# Patient Record
Sex: Female | Born: 1980 | Race: White | Hispanic: No | Marital: Married | Smoking: Never smoker
Health system: Southern US, Community
[De-identification: ages and names within clinical notes are randomized; demographics above are authoritative.]

## PROBLEM LIST (undated history)

## (undated) DIAGNOSIS — K219 Gastro-esophageal reflux disease without esophagitis: Secondary | ICD-10-CM

## (undated) DIAGNOSIS — J329 Chronic sinusitis, unspecified: Secondary | ICD-10-CM

## (undated) HISTORY — PX: TYMPANOSTOMY TUBE PLACEMENT: SHX32

## (undated) HISTORY — DX: Chronic sinusitis, unspecified: J32.9

## (undated) HISTORY — DX: Gastro-esophageal reflux disease without esophagitis: K21.9

---

## 2016-11-08 ENCOUNTER — Ambulatory Visit (INDEPENDENT_AMBULATORY_CARE_PROVIDER_SITE_OTHER): Payer: BLUE CROSS/BLUE SHIELD | Admitting: Pediatrics

## 2016-11-08 ENCOUNTER — Encounter: Payer: Self-pay | Admitting: Pediatrics

## 2016-11-08 VITALS — BP 116/78 | HR 60 | Temp 97.9°F | Resp 16 | Ht 64.57 in | Wt 133.8 lb

## 2016-11-08 DIAGNOSIS — K219 Gastro-esophageal reflux disease without esophagitis: Secondary | ICD-10-CM

## 2016-11-08 DIAGNOSIS — J0100 Acute maxillary sinusitis, unspecified: Secondary | ICD-10-CM

## 2016-11-08 DIAGNOSIS — M2669 Other specified disorders of temporomandibular joint: Secondary | ICD-10-CM | POA: Diagnosis not present

## 2016-11-08 DIAGNOSIS — J3089 Other allergic rhinitis: Secondary | ICD-10-CM | POA: Diagnosis not present

## 2016-11-08 DIAGNOSIS — M26649 Arthritis of unspecified temporomandibular joint: Secondary | ICD-10-CM | POA: Insufficient documentation

## 2016-11-08 MED ORDER — AMOXICILLIN-POT CLAVULANATE 875-125 MG PO TABS: ORAL_TABLET | ORAL | 0 refills | Status: DC

## 2016-11-08 MED ORDER — FLUTICASONE PROPIONATE 50 MCG/ACT NA SUSP: 1.0000 | Freq: Two times a day (BID) | NASAL | 5 refills | Status: DC

## 2016-11-08 MED ORDER — OMEPRAZOLE 20 MG PO CPDR: 20.0000 mg | DELAYED_RELEASE_CAPSULE | Freq: Every day | ORAL | 5 refills | Status: DC

## 2016-11-08 NOTE — Patient Instructions (Addendum)
Environmental control of dust and mold Claritin 10 mg once a day for runny nose Nasal saline irrigations twice a day followed by fluticasone 1 spray per nostril twice a day Add prednisone 20 mg twice a day for 3 days, 20 mg on the fourth day, 10 mg on the fifth day See your dentist regarding TMJ.  Syndrome giving you headaches Augmentin 875 mg every 12 hours for 10 days for infection  Call me if you're not doing well on this treatment plan Continue omeprazole 20 mg once a day for acid reflux

## 2016-11-08 NOTE — Progress Notes (Signed)
[address] [place] [address] Dept: [phone]  New Patient Note  Patient ID: Lindsey KnackHeather Hall, female    DOB: 1981/02/05  Age: 35 y.o. MRN: [id number] Date of Office Visit: 11/08/2016 Referring provider: No referring provider defined for this encounter.    Chief Complaint: Nasal Congestion (lots of mucus, ears feel warm inside.) and Food Intolerance (avocado has vomiting and diarrhea for about 1 to 2 days later leaving her stomach upset for several days)  HPI Lindsey KnackHeather Hall presents for evaluation of sinus pressure, headaches, postnasal drainage since July 2017. Her postnasal drainage has been discolored. She did not have a great deal of relief from Levaquin. She has a history of seasonal allergic rhinitis for many years. As a child she needed ventilation tubes twice. She has aggravation of her nasal symptoms on exposure to dust and cigarette smoke. Omeprazole has helped her heartburn and stomach irritation. She had pneumonia once at [age] or 35 years of age. She has never had asthma or eczema or hives. She has tried montelukast 10 and fluticasone nasal spray without a great deal of relief  Review of Systems  Constitutional: Negative.   HENT:       Nasal congestion, sinus pressure and  recurrent sinus infections since July of this year. History of seasonal allergic rhinitis  Eyes: Negative.   Respiratory: Negative.   Cardiovascular: Negative.   Gastrointestinal:       Heartburn at times  Genitourinary: Negative.   Musculoskeletal: Negative.   Skin: Negative.   Neurological: Negative.   Endo/Heme/Allergies:       No diabetes or thyroid disease  Psychiatric/Behavioral: Negative.     Outpatient Encounter Prescriptions as of 11/08/2016  Medication Sig  . acetaminophen (TYLENOL) 325 MG tablet Take by mouth.  . clindamycin (CLEOCIN T) 1 % lotion   . fluticasone (FLONASE) 50 MCG/ACT nasal spray Place 1 spray into both nostrils 2 (two) times daily.  Marland Kitchen. ibuprofen (ADVIL,MOTRIN)  200 MG tablet Take 200 mg by mouth.  . magnesium citrate (V-R MAGNESIUM CITRATE) SOLN Take 296 mLs by mouth.  . montelukast (SINGULAIR) 10 MG tablet   . Multiple Vitamin (MULTIVITAMIN) capsule Take by mouth.  Marland Kitchen. omeprazole (PRILOSEC) 20 MG capsule Take 1 capsule (20 mg total) by mouth daily.  Marland Kitchen. spironolactone (ALDACTONE) 50 MG tablet   . tretinoin (RETIN-A) 0.025 % cream   . [DISCONTINUED] fluticasone (FLONASE) 50 MCG/ACT nasal spray Place into the nose.  . [DISCONTINUED] omeprazole (PRILOSEC) 20 MG capsule Take 20 mg by mouth daily.  Marland Kitchen. amoxicillin-clavulanate (AUGMENTIN) 875-125 MG tablet Take 1 tablet every 12 hours for 10 days for infection   No facility-administered encounter medications on file as of 11/08/2016.      Drug Allergies:  Allergies  Allergen Reactions  . Other     avocado caused vomiting and diarrhea with stomach discomfort for several days    Family History: Deola's family history includes Migraines in her mother... Her brother has asthma. Her father has allergic rhinitis. There is no family history of eczema, hives, food allergies, chronic bronchitis or emphysema.  Social and environmental. They have 1 dog in the home. She is not exposed to cigarette smoking. She is a Manufacturing systems engineerpreschool teacher. She has not smoked cigarettes in the past  Physical Exam: BP 116/78 (BP Location: Right Arm, Patient Position: Sitting, Cuff Size: Normal)   Pulse 60   Temp 97.9 F (36.6 C) (Oral)   Resp 16   Ht 5' 4.57" (1.64 m)   Wt 133 lb  13.1 oz (60.7 kg)   BMI 22.57 kg/m    Physical Exam  Constitutional: She is oriented to person, place, and time. She appears well-developed and well-nourished.  HENT:  Eyes normal. Ears normal. Nose moderate swelling of nasal turbinates. Pharynx normal except for yellow-green postnasal drainage  Neck: Neck supple. No thyromegaly present.  Tender TM joints bilaterally  Cardiovascular:  S1 and S2 normal no murmurs  Pulmonary/Chest:  Clear to  percussion and auscultation  Abdominal: Soft. There is no tenderness (no hepatosplenomegaly).  Lymphadenopathy:    She has no cervical adenopathy.  Neurological: She is alert and oriented to person, place, and time.  Skin:  Clear  Psychiatric: She has a normal mood and affect. Her behavior is normal. Judgment and thought content normal.  Vitals reviewed.   Diagnostics:  Allergy skin tests were positive to some molds on intradermal testing only  Assessment  Assessment and Plan: 1. Other allergic rhinitis   2. Gastroesophageal reflux disease without esophagitis   3. Acute non-recurrent maxillary sinusitis   4. TMJ arthritis     Meds ordered this encounter  Medications  . amoxicillin-clavulanate (AUGMENTIN) 875-125 MG tablet    Sig: Take 1 tablet every 12 hours for 10 days for infection    Dispense:  20 tablet    Refill:  0  . fluticasone (FLONASE) 50 MCG/ACT nasal spray    Sig: Place 1 spray into both nostrils 2 (two) times daily.    Dispense:  16 g    Refill:  5  . omeprazole (PRILOSEC) 20 MG capsule    Sig: Take 1 capsule (20 mg total) by mouth daily.    Dispense:  30 capsule    Refill:  5    Patient Instructions  Environmental control of dust and mold Claritin 10 mg once a day for runny nose Nasal saline irrigations twice a day followed by fluticasone 1 spray per nostril twice a day Add prednisone 20 mg twice a day for 3 days, 20 mg on the fourth day, 10 mg on the fifth day See your dentist regarding TMJ.  Syndrome giving you headaches Augmentin 875 mg every 12 hours for 10 days for infection  Call me if you're not doing well on this treatment plan Continue omeprazole 20 mg once a day for acid reflux    Return in about 4 weeks (around 12/06/2016).   Thank you for the opportunity to care for this patient.  Please do not hesitate to contact me with questions.  Tonette BihariJ. A. Sophee Mckimmy, M.D.  [facility] [address] [place], [place]  [id number] [phone]

## 2017-05-14 ENCOUNTER — Other Ambulatory Visit: Payer: Self-pay | Admitting: Pediatrics

## 2018-06-15 ENCOUNTER — Ambulatory Visit (INDEPENDENT_AMBULATORY_CARE_PROVIDER_SITE_OTHER): Payer: PRIVATE HEALTH INSURANCE | Admitting: Family Medicine

## 2018-06-15 ENCOUNTER — Encounter: Payer: Self-pay | Admitting: Family Medicine

## 2018-06-15 VITALS — BP 100/66 | HR 73 | Temp 98.3°F | Resp 16 | Ht 65.0 in | Wt 126.2 lb

## 2018-06-15 DIAGNOSIS — K219 Gastro-esophageal reflux disease without esophagitis: Secondary | ICD-10-CM | POA: Diagnosis not present

## 2018-06-15 DIAGNOSIS — J0100 Acute maxillary sinusitis, unspecified: Secondary | ICD-10-CM | POA: Diagnosis not present

## 2018-06-15 DIAGNOSIS — J3089 Other allergic rhinitis: Secondary | ICD-10-CM

## 2018-06-15 NOTE — Patient Instructions (Addendum)
Continue environmental control of dust and mold Allegra 180 mg once a day for runny nose. Hold this for now while you are congested.  Begin Mucinex [phone] mg twice a day to thin mucus secretions. Nasal saline irrigations twice a day followed by fluticasone 1 spray per nostril twice a day Continue omeprazole 20 mg once a day for acid reflux Prednisone 10 mg tablets. Take 2 tablets twice a day for 3 days, then take 2 tablets once a day for 1 day, then take 1 tablet once a day for 1 day, then stop. Return for environmental skin testing. Do not take any antihistamines 3 days prior to skin testing appointment Follow up with ENT for further evaluation of your left ear. You [facility]id [facility]  Call me if you're not doing well on this treatment plan  Follow up in the clinic for skin testing in 1-2 weeks

## 2018-06-15 NOTE — Progress Notes (Addendum)
[address] [place] [address] Dept: [phone]  FOLLOW UP NOTE  Patient ID: Lindsey KnackHeather Hall, female    DOB: 1981/09/27  Age: 37 y.o. MRN: [id number] Date of Office Visit: 06/15/2018  Assessment  Chief Complaint: Otalgia and Nasal Congestion  HPI Lindsey KnackHeather Hall is a 37 year old female who presents to the clinic for a follow up visit. She was last seen in this clinic on 11/08/2016 by Dr. Beaulah DinningBardelas for evaluation of allergic rhinitis, reflux, acute sinusitis, and TMJ arthritis. At that time, she was prescribed a course of Augmentin. She also began Flonase nasal spray and Prilosec once a day. Her intradermal skin test was only positive to mold mix 4.   In the interim, she reports that she visited an Urgent Care for acute sinusitis and was given Augmentin which she finished about the first week of June with resolution of symptoms.   At today's visit, she reports increased sinus pressure, nasal congestion, thick yellow drainage when blowing her nose. She reports the sinus pressure is mainly below both eyes with the left more than the right. She denies fever, sweats, or chills and sick contacts. She [place] and has been experiencing sinus and nasal congestion in the late spring/earyl summer and fall seasons for the last 2 years. Her symptoms are aggravated by being outside. She continues NeilMed rinses twice a day and Flonase nasal spray daily. She reports that she has started to experience intermittent discharge from her left ear and frequent popping noises over the last week which began after blowing her nose extremely hard. She denies ear pain. She reports a long history of childhood ear infections and 2 sets of tympanostomy tubes.  Her current medications are listed in the chart.   Drug Allergies:  Allergies  Allergen Reactions  . Other     avocado caused vomiting and diarrhea with stomach discomfort for several days     Physical Exam: BP 100/66 (BP Location: Right Arm, Patient Position: Sitting, Cuff Size: Normal)   Pulse 73   Temp 98.3 F (36.8 C) (Oral)   Resp 16   Ht 5\' 5"  (1.651 m)   Wt 126 lb 3.2 oz (57.2 kg)   SpO2 99%   BMI 21.00 kg/m    Physical Exam  Constitutional: She is oriented to person, place, and time. She appears well-developed and well-nourished.  HENT:  Head: Normocephalic.  Right Ear: External ear normal.  Mouth/Throat: Oropharynx is clear and moist.  Left ear TM with no light reflex and possible perforation. Bilateral nares slightly erythematous and edematous with no nasal drainage noted. Pharynx normal. Eyes normal.   Eyes: Conjunctivae are normal.  Neck: Normal range of motion. Neck supple.  Cardiovascular: Normal rate, regular rhythm and normal heart sounds.  No murmur noted.  Pulmonary/Chest: Effort normal and breath sounds normal.  Lungs clear to auscultation  Musculoskeletal: Normal range of motion.  Neurological: She is alert and oriented to person, place, and time.  Skin: Skin is warm and dry.  Psychiatric: She has a normal mood and affect. Her behavior is normal. Judgment and thought content normal.      Assessment and Plan: 1. Other allergic rhinitis   2. Gastroesophageal reflux disease without esophagitis   3. Acute non-recurrent maxillary sinusitis     Patient Instructions  Continue environmental control of dust and mold Allegra 180 mg once a day for runny nose. Hold this for now while you are  congested.  Begin Mucinex [phone] mg twice a day to thin mucus secretions. Nasal saline irrigations twice a day followed by fluticasone 1 spray per nostril twice a day Continue omeprazole 20 mg once a day for acid reflux Prednisone 10 mg tablets. Take 2 tablets twice a day for 3 days, then take 2 tablets once a day for 1 day, then take 1 tablet once a day for 1 day, then stop. Return for environmental skin testing. Do not take any antihistamines 3 days prior  to skin testing appointment Follow up with ENT for further evaluation of your left ear. You saw Dr[facility] Richardson Landry at [facility]  Call me if you're not doing well on this treatment plan  Follow up in the clinic for skin testing in 1-2 weeks   Return in about 2 weeks (around 06/29/2018), or if symptoms worsen or fail to improve.    Thank you for the opportunity to care for this patient.  Please do not hesitate to contact me with questions.  Thermon Leyland, FNP [facility]  ----------------------------------------------------------  I have provided oversight concerning [facility]'s evaluation and treatment of this patient's health issues addressed during today's encounter.  I agree with the assessment and therapeutic plan as outlined in the note.   Signed,   R Jorene Guest, MD

## 2018-06-19 ENCOUNTER — Telehealth: Payer: Self-pay | Admitting: *Deleted

## 2018-06-19 NOTE — Telephone Encounter (Signed)
Pt informed. She is doing sinus rinses bid and Flonase as directed. Her mucus is still green and she continues having dizziness. The ENT did prescribe an antibiotic ear drop that she is using. Instructed her again from Anne's last visit note to stop the allegra and take Mucinex [phone] bid with plenty of water. She stated she was using both. Instructed her to call us back if her symptoms get any worse.

## 2018-06-19 NOTE — Telephone Encounter (Signed)
Please tell her that the nasal saline rinses and Flonase will help the most with her ear pressure. Thank you

## 2018-06-19 NOTE — Telephone Encounter (Signed)
Pt seen on Thursday morning by Thermon LeylandAnne Ambs and finished prednisone pack but and not feeling any better today. Lots of sinus pressure, tingling in ears. Went to ENT doctor that day and found out she has infection in middle ear and ear drum had ruptured. She called for a recommendation on an antihistamine or something to help. Please advise.

## 2018-06-20 ENCOUNTER — Telehealth: Payer: Self-pay | Admitting: Family Medicine

## 2018-06-20 NOTE — Telephone Encounter (Signed)
Patient saw ENT specialist who prescribed an antibiotic ear drop as well as Augmentin. She will call this office for fever, worsening symptoms, or any further questions.

## 2018-06-20 NOTE — Telephone Encounter (Signed)
Thank you :)

## 2018-06-29 ENCOUNTER — Encounter: Payer: Self-pay | Admitting: Family Medicine

## 2018-06-29 ENCOUNTER — Ambulatory Visit (INDEPENDENT_AMBULATORY_CARE_PROVIDER_SITE_OTHER): Payer: PRIVATE HEALTH INSURANCE | Admitting: Family Medicine

## 2018-06-29 VITALS — BP 98/66 | HR 73 | Temp 97.8°F | Resp 16 | Ht 64.76 in | Wt 122.6 lb

## 2018-06-29 DIAGNOSIS — J3089 Other allergic rhinitis: Secondary | ICD-10-CM

## 2018-06-29 DIAGNOSIS — K219 Gastro-esophageal reflux disease without esophagitis: Secondary | ICD-10-CM

## 2018-06-29 DIAGNOSIS — J302 Other seasonal allergic rhinitis: Secondary | ICD-10-CM

## 2018-06-29 NOTE — Progress Notes (Addendum)
[address] [place] [address] Dept: [phone]  FOLLOW UP NOTE  Patient ID: Lindsey KnackHeather Hall, female    DOB: [birth year]  Age: 37 y.o. MRN: [id number] Date of Office Visit: 06/29/2018  Assessment  Chief Complaint: Allergic Rhinitis  (stuffy/runny nose, sinus pressure)  HPI Lindsey Hall is a 37 year old female who presents to the clinic for a follow up visit. She was last seen in this clinic on 06/15/2018 by Thermon LeylandAnne Jadira Nierman, NP for evaluation of acute sinusitis, allergic rhinitis, and reflux. At that time, she was noted to have a perforated right TM and was recommended that she go to an ENT specialist for further evaluation. She also began a prednisone taper, mucinex, nasal saline rinses, and continued omeprazole.   In the interim, she saw ENT one week ago and was started on Augmentin for acute sinusitis.  At today's visit, she reports she is feeling well. She has not taken any antihistamines for the last 3 days. She continues on Augmentin with 4 days remaining. She reports a continued thick nasal drainage that is worse in the morning and changes color to clear in the afternoon. The nasal symptoms increase when she spends any time outside.   Her current medications are listed in the chart.    Drug Allergies:  Allergies  Allergen Reactions  . Other     avocado caused vomiting and diarrhea with stomach discomfort for several days    Physical Exam: BP 98/66   Pulse 73   Temp 97.8 F (36.6 C) (Oral)   Resp 16   Ht 5' 4.76" (1.645 m)   Wt 122 lb 9.6 oz (55.6 kg)   SpO2 97%   BMI 20.55 kg/m    Physical Exam  Constitutional: She is oriented to person, place, and time. She appears well-developed and well-nourished.  HENT:  Head: Normocephalic.  Right Ear: External ear normal.  Left Ear: External ear normal.  Mouth/Throat: Oropharynx is clear and moist.  Bilateral nares slightly erythematous with no nasal drainage noted. Pharynx normal. Ears normal. Eyes normal.   Eyes:  Conjunctivae are normal.  Neck: Normal range of motion. Neck supple.  Cardiovascular: Normal rate, regular rhythm and normal heart sounds.  No murmur noted  Pulmonary/Chest: Effort normal and breath sounds normal.  Lungs clear to auscultation  Musculoskeletal: Normal range of motion.  Neurological: She is alert and oriented to person, place, and time.  Skin: Skin is warm and dry.  Psychiatric: She has a normal mood and affect. Her behavior is normal. Judgment and thought content normal.  Vitals reviewed.   Diagnostics: Percutaneous skin testing slightly positive to rough pigweed. Intradermal testing positive to weed mix and mold 4.  Assessment and Plan: 1. Seasonal and perennial allergic rhinitis   2. Gastroesophageal reflux disease without esophagitis      Patient Instructions  Allergic rhinitis - Your skin testing was positive to rough pigweed, weed mix, and molds. - Allergy avoidance measures have been provided - Continue Allegra 180 mg once a day as needed for a runny nose. Do not take this medication if you have nasal congestion. - Continue Flonase 2 sprays in each nostril once a day as needed for a stuffy nose - Contine nasal saline rinses. Use this prior to medicated nasal sprays. - Continue taking the remainder of the Augmentin you were prescribed last week and keep your follow up appointment with your ENT specialist.  - Information about allergy injections has been provided in written form.   Follow  up in 6 months or sooner if needed   Return in about 6 months (around 12/30/2018), or if symptoms worsen or fail to improve.    Thank you for the opportunity to care for this patient.  Please do not hesitate to contact me with questions.  Thermon Leyland, FNP [facility]  _________________________________________________  I have provided oversight concerning Lindsey Hall's evaluation and treatment of this patient's health issues addressed during  today's encounter.  I agree with the assessment and therapeutic plan as outlined in the note.   Signed,   R Jorene Guest, MD

## 2018-06-29 NOTE — Patient Instructions (Addendum)
Allergic rhinitis - Your skin testing was positive to rough pigweed, weed mix, and molds. - Allergy avoidance measures have been provided - Continue Allegra 180 mg once a day as needed for a runny nose. Do not take this medication if you have nasal congestion. - Continue Flonase 2 sprays in each nostril once a day as needed for a stuffy nose - Contine nasal saline rinses. Use this prior to medicated nasal sprays. - Continue taking the remainder of the Augmentin you were prescribed last week and keep your follow up appointment with your ENT specialist.  - Information about allergy shots has been provided in written form.   Follow up in 6 months or sooner if needed

## 2018-08-16 DIAGNOSIS — J3089 Other allergic rhinitis: Secondary | ICD-10-CM

## 2018-08-16 NOTE — Progress Notes (Signed)
VIALS EXP 08-17-19 

## 2018-08-16 NOTE — Addendum Note (Signed)
Addended by: Stephannie LiBARDELAS, Anwitha Mapes A on: 08/16/2018 02:19 PM   Modules accepted: Orders

## 2018-08-17 DIAGNOSIS — J301 Allergic rhinitis due to pollen: Secondary | ICD-10-CM

## 2018-09-01 ENCOUNTER — Ambulatory Visit (INDEPENDENT_AMBULATORY_CARE_PROVIDER_SITE_OTHER): Payer: PRIVATE HEALTH INSURANCE

## 2018-09-01 ENCOUNTER — Other Ambulatory Visit: Payer: Self-pay

## 2018-09-01 DIAGNOSIS — J309 Allergic rhinitis, unspecified: Secondary | ICD-10-CM | POA: Diagnosis not present

## 2018-09-01 MED ORDER — EPINEPHRINE 0.3 MG/0.3ML IJ SOAJ: INTRAMUSCULAR | 1 refills | Status: DC

## 2018-09-01 NOTE — Progress Notes (Signed)
Immunotherapy   Patient Details  Name: Lindsey KnackHeather Hall MRN: [id number] Date of Birth: June 20, 1981  09/01/2018  Lindsey KnackHeather Hall started injections for  weed-mold Following schedule: B  Frequency:1 time per week Epi-Pen:Epi-Pen Available  Consent signed and patient instructions given.   Murray HodgkinsMichelle Myron Stankovich 09/01/2018, 2:42 PM

## 2018-09-01 NOTE — Telephone Encounter (Signed)
Pt. Started allergy injections today paperwork signed and auvi q training with pt. Was done. Sent in the auvi q to aspn.

## 2018-09-08 ENCOUNTER — Ambulatory Visit (INDEPENDENT_AMBULATORY_CARE_PROVIDER_SITE_OTHER): Payer: PRIVATE HEALTH INSURANCE | Admitting: *Deleted

## 2018-09-08 DIAGNOSIS — J309 Allergic rhinitis, unspecified: Secondary | ICD-10-CM

## 2018-09-15 ENCOUNTER — Ambulatory Visit (INDEPENDENT_AMBULATORY_CARE_PROVIDER_SITE_OTHER): Payer: PRIVATE HEALTH INSURANCE

## 2018-09-15 DIAGNOSIS — J309 Allergic rhinitis, unspecified: Secondary | ICD-10-CM

## 2018-09-22 ENCOUNTER — Ambulatory Visit (INDEPENDENT_AMBULATORY_CARE_PROVIDER_SITE_OTHER): Payer: PRIVATE HEALTH INSURANCE

## 2018-09-22 DIAGNOSIS — J309 Allergic rhinitis, unspecified: Secondary | ICD-10-CM | POA: Diagnosis not present

## 2018-09-29 ENCOUNTER — Ambulatory Visit (INDEPENDENT_AMBULATORY_CARE_PROVIDER_SITE_OTHER): Payer: PRIVATE HEALTH INSURANCE

## 2018-09-29 DIAGNOSIS — J309 Allergic rhinitis, unspecified: Secondary | ICD-10-CM

## 2018-10-06 ENCOUNTER — Ambulatory Visit (INDEPENDENT_AMBULATORY_CARE_PROVIDER_SITE_OTHER): Payer: PRIVATE HEALTH INSURANCE | Admitting: *Deleted

## 2018-10-06 DIAGNOSIS — J309 Allergic rhinitis, unspecified: Secondary | ICD-10-CM

## 2018-10-16 ENCOUNTER — Ambulatory Visit (INDEPENDENT_AMBULATORY_CARE_PROVIDER_SITE_OTHER): Payer: PRIVATE HEALTH INSURANCE

## 2018-10-16 DIAGNOSIS — J309 Allergic rhinitis, unspecified: Secondary | ICD-10-CM | POA: Diagnosis not present

## 2018-10-25 ENCOUNTER — Ambulatory Visit (INDEPENDENT_AMBULATORY_CARE_PROVIDER_SITE_OTHER): Payer: PRIVATE HEALTH INSURANCE

## 2018-10-25 DIAGNOSIS — J309 Allergic rhinitis, unspecified: Secondary | ICD-10-CM | POA: Diagnosis not present

## 2018-11-03 ENCOUNTER — Ambulatory Visit (INDEPENDENT_AMBULATORY_CARE_PROVIDER_SITE_OTHER): Payer: PRIVATE HEALTH INSURANCE

## 2018-11-03 DIAGNOSIS — J309 Allergic rhinitis, unspecified: Secondary | ICD-10-CM | POA: Diagnosis not present

## 2018-11-10 ENCOUNTER — Ambulatory Visit (INDEPENDENT_AMBULATORY_CARE_PROVIDER_SITE_OTHER): Payer: PRIVATE HEALTH INSURANCE | Admitting: *Deleted

## 2018-11-10 DIAGNOSIS — J309 Allergic rhinitis, unspecified: Secondary | ICD-10-CM | POA: Diagnosis not present

## 2018-11-20 ENCOUNTER — Ambulatory Visit (INDEPENDENT_AMBULATORY_CARE_PROVIDER_SITE_OTHER): Payer: PRIVATE HEALTH INSURANCE

## 2018-11-20 DIAGNOSIS — J309 Allergic rhinitis, unspecified: Secondary | ICD-10-CM | POA: Diagnosis not present

## 2018-11-29 ENCOUNTER — Ambulatory Visit (INDEPENDENT_AMBULATORY_CARE_PROVIDER_SITE_OTHER): Payer: PRIVATE HEALTH INSURANCE

## 2018-11-29 DIAGNOSIS — J309 Allergic rhinitis, unspecified: Secondary | ICD-10-CM | POA: Diagnosis not present

## 2018-12-08 ENCOUNTER — Ambulatory Visit (INDEPENDENT_AMBULATORY_CARE_PROVIDER_SITE_OTHER): Payer: PRIVATE HEALTH INSURANCE | Admitting: *Deleted

## 2018-12-08 DIAGNOSIS — J309 Allergic rhinitis, unspecified: Secondary | ICD-10-CM

## 2018-12-11 NOTE — Progress Notes (Addendum)
VIALS not needed.

## 2018-12-18 ENCOUNTER — Ambulatory Visit (INDEPENDENT_AMBULATORY_CARE_PROVIDER_SITE_OTHER): Payer: PRIVATE HEALTH INSURANCE

## 2018-12-18 DIAGNOSIS — J309 Allergic rhinitis, unspecified: Secondary | ICD-10-CM

## 2018-12-27 ENCOUNTER — Ambulatory Visit (INDEPENDENT_AMBULATORY_CARE_PROVIDER_SITE_OTHER): Payer: PRIVATE HEALTH INSURANCE

## 2018-12-27 DIAGNOSIS — J309 Allergic rhinitis, unspecified: Secondary | ICD-10-CM | POA: Diagnosis not present

## 2019-01-05 ENCOUNTER — Ambulatory Visit (INDEPENDENT_AMBULATORY_CARE_PROVIDER_SITE_OTHER): Payer: PRIVATE HEALTH INSURANCE | Admitting: *Deleted

## 2019-01-05 DIAGNOSIS — J309 Allergic rhinitis, unspecified: Secondary | ICD-10-CM | POA: Diagnosis not present

## 2019-02-05 ENCOUNTER — Ambulatory Visit (INDEPENDENT_AMBULATORY_CARE_PROVIDER_SITE_OTHER): Payer: PRIVATE HEALTH INSURANCE

## 2019-02-05 DIAGNOSIS — J309 Allergic rhinitis, unspecified: Secondary | ICD-10-CM

## 2019-02-13 ENCOUNTER — Ambulatory Visit (INDEPENDENT_AMBULATORY_CARE_PROVIDER_SITE_OTHER): Payer: PRIVATE HEALTH INSURANCE

## 2019-02-13 DIAGNOSIS — J309 Allergic rhinitis, unspecified: Secondary | ICD-10-CM | POA: Diagnosis not present

## 2019-02-27 ENCOUNTER — Ambulatory Visit (INDEPENDENT_AMBULATORY_CARE_PROVIDER_SITE_OTHER): Payer: PRIVATE HEALTH INSURANCE

## 2019-02-27 DIAGNOSIS — J309 Allergic rhinitis, unspecified: Secondary | ICD-10-CM | POA: Diagnosis not present

## 2019-03-08 ENCOUNTER — Ambulatory Visit (INDEPENDENT_AMBULATORY_CARE_PROVIDER_SITE_OTHER): Payer: PRIVATE HEALTH INSURANCE

## 2019-03-08 DIAGNOSIS — J309 Allergic rhinitis, unspecified: Secondary | ICD-10-CM

## 2019-03-15 ENCOUNTER — Ambulatory Visit (INDEPENDENT_AMBULATORY_CARE_PROVIDER_SITE_OTHER): Payer: PRIVATE HEALTH INSURANCE

## 2019-03-15 DIAGNOSIS — J309 Allergic rhinitis, unspecified: Secondary | ICD-10-CM | POA: Diagnosis not present

## 2019-03-22 ENCOUNTER — Ambulatory Visit (INDEPENDENT_AMBULATORY_CARE_PROVIDER_SITE_OTHER): Payer: PRIVATE HEALTH INSURANCE | Admitting: *Deleted

## 2019-03-22 DIAGNOSIS — J309 Allergic rhinitis, unspecified: Secondary | ICD-10-CM | POA: Diagnosis not present

## 2019-04-16 ENCOUNTER — Ambulatory Visit (INDEPENDENT_AMBULATORY_CARE_PROVIDER_SITE_OTHER): Payer: PRIVATE HEALTH INSURANCE

## 2019-04-16 DIAGNOSIS — J309 Allergic rhinitis, unspecified: Secondary | ICD-10-CM | POA: Diagnosis not present

## 2019-04-25 ENCOUNTER — Ambulatory Visit (INDEPENDENT_AMBULATORY_CARE_PROVIDER_SITE_OTHER): Payer: PRIVATE HEALTH INSURANCE

## 2019-04-25 DIAGNOSIS — J309 Allergic rhinitis, unspecified: Secondary | ICD-10-CM | POA: Diagnosis not present

## 2019-05-04 ENCOUNTER — Ambulatory Visit (INDEPENDENT_AMBULATORY_CARE_PROVIDER_SITE_OTHER): Payer: PRIVATE HEALTH INSURANCE

## 2019-05-04 DIAGNOSIS — J309 Allergic rhinitis, unspecified: Secondary | ICD-10-CM

## 2019-05-21 ENCOUNTER — Ambulatory Visit (INDEPENDENT_AMBULATORY_CARE_PROVIDER_SITE_OTHER): Payer: PRIVATE HEALTH INSURANCE

## 2019-05-21 DIAGNOSIS — J309 Allergic rhinitis, unspecified: Secondary | ICD-10-CM | POA: Diagnosis not present

## 2019-05-31 ENCOUNTER — Ambulatory Visit (INDEPENDENT_AMBULATORY_CARE_PROVIDER_SITE_OTHER): Payer: PRIVATE HEALTH INSURANCE

## 2019-05-31 DIAGNOSIS — J309 Allergic rhinitis, unspecified: Secondary | ICD-10-CM

## 2019-06-11 ENCOUNTER — Ambulatory Visit (INDEPENDENT_AMBULATORY_CARE_PROVIDER_SITE_OTHER): Payer: PRIVATE HEALTH INSURANCE

## 2019-06-11 DIAGNOSIS — J309 Allergic rhinitis, unspecified: Secondary | ICD-10-CM | POA: Diagnosis not present

## 2019-06-19 ENCOUNTER — Ambulatory Visit (INDEPENDENT_AMBULATORY_CARE_PROVIDER_SITE_OTHER): Payer: PRIVATE HEALTH INSURANCE

## 2019-06-19 DIAGNOSIS — J309 Allergic rhinitis, unspecified: Secondary | ICD-10-CM | POA: Diagnosis not present

## 2019-06-27 ENCOUNTER — Ambulatory Visit (INDEPENDENT_AMBULATORY_CARE_PROVIDER_SITE_OTHER): Payer: PRIVATE HEALTH INSURANCE

## 2019-06-27 DIAGNOSIS — J309 Allergic rhinitis, unspecified: Secondary | ICD-10-CM | POA: Diagnosis not present

## 2019-07-06 ENCOUNTER — Ambulatory Visit (INDEPENDENT_AMBULATORY_CARE_PROVIDER_SITE_OTHER): Payer: PRIVATE HEALTH INSURANCE

## 2019-07-06 DIAGNOSIS — J309 Allergic rhinitis, unspecified: Secondary | ICD-10-CM

## 2019-07-11 NOTE — Progress Notes (Signed)
Exp 07/11/20 

## 2019-07-12 DIAGNOSIS — J3089 Other allergic rhinitis: Secondary | ICD-10-CM | POA: Diagnosis not present

## 2019-07-23 ENCOUNTER — Ambulatory Visit (INDEPENDENT_AMBULATORY_CARE_PROVIDER_SITE_OTHER): Payer: PRIVATE HEALTH INSURANCE

## 2019-07-23 DIAGNOSIS — J309 Allergic rhinitis, unspecified: Secondary | ICD-10-CM

## 2019-08-03 ENCOUNTER — Ambulatory Visit (INDEPENDENT_AMBULATORY_CARE_PROVIDER_SITE_OTHER): Payer: PRIVATE HEALTH INSURANCE

## 2019-08-03 DIAGNOSIS — J309 Allergic rhinitis, unspecified: Secondary | ICD-10-CM | POA: Diagnosis not present

## 2019-08-16 ENCOUNTER — Ambulatory Visit (INDEPENDENT_AMBULATORY_CARE_PROVIDER_SITE_OTHER): Payer: PRIVATE HEALTH INSURANCE

## 2019-08-16 DIAGNOSIS — J309 Allergic rhinitis, unspecified: Secondary | ICD-10-CM | POA: Diagnosis not present

## 2019-08-26 ENCOUNTER — Encounter (HOSPITAL_BASED_OUTPATIENT_CLINIC_OR_DEPARTMENT_OTHER): Payer: Self-pay | Admitting: Emergency Medicine

## 2019-08-26 ENCOUNTER — Emergency Department (HOSPITAL_BASED_OUTPATIENT_CLINIC_OR_DEPARTMENT_OTHER)
Admission: EM | Admit: 2019-08-26 | Discharge: 2019-08-26 | Disposition: A | Discharge status: Home / Self Care | Payer: No Typology Code available for payment source | Attending: Emergency Medicine | Admitting: Emergency Medicine

## 2019-08-26 ENCOUNTER — Other Ambulatory Visit: Payer: Self-pay

## 2019-08-26 ENCOUNTER — Emergency Department (HOSPITAL_BASED_OUTPATIENT_CLINIC_OR_DEPARTMENT_OTHER): Payer: No Typology Code available for payment source

## 2019-08-26 DIAGNOSIS — A09 Infectious gastroenteritis and colitis, unspecified: Secondary | ICD-10-CM | POA: Diagnosis not present

## 2019-08-26 DIAGNOSIS — Z20828 Contact with and (suspected) exposure to other viral communicable diseases: Secondary | ICD-10-CM | POA: Diagnosis not present

## 2019-08-26 DIAGNOSIS — R109 Unspecified abdominal pain: Secondary | ICD-10-CM | POA: Diagnosis present

## 2019-08-26 LAB — COMPREHENSIVE METABOLIC PANEL
ALT: 16 U/L (ref 0–44)
AST: 15 U/L (ref 15–41)
Albumin: 3.4 g/dL — ABNORMAL LOW (ref 3.5–5.0)
Alkaline Phosphatase: 51 U/L (ref 38–126)
Anion gap: 12 (ref 5–15)
BUN: 10 mg/dL (ref 6–20)
CO2: 22 mmol/L (ref 22–32)
Calcium: 8.5 mg/dL — ABNORMAL LOW (ref 8.9–10.3)
Chloride: 97 mmol/L — ABNORMAL LOW (ref 98–111)
Creatinine, Ser: 0.78 mg/dL (ref 0.44–1.00)
GFR calc Af Amer: >60 mL/min (ref >60)
GFR calc non Af Amer: >60 mL/min (ref >60)
Glucose, Bld: 141 mg/dL — ABNORMAL HIGH (ref 70–99)
Potassium: 3.8 mmol/L (ref 3.5–5.1)
Sodium: 131 mmol/L — ABNORMAL LOW (ref 135–145)
Total Bilirubin: 0.5 mg/dL (ref 0.3–1.2)
Total Protein: 6.2 g/dL — ABNORMAL LOW (ref 6.5–8.1)

## 2019-08-26 LAB — PREGNANCY, URINE: Preg Test, Ur: NEGATIVE

## 2019-08-26 LAB — URINALYSIS, ROUTINE W REFLEX MICROSCOPIC
Bilirubin Urine: NEGATIVE
Glucose, UA: NEGATIVE mg/dL
Ketones, ur: 15 mg/dL — AB
Leukocytes,Ua: NEGATIVE
Nitrite: NEGATIVE
Protein, ur: NEGATIVE mg/dL
Specific Gravity, Urine: 1.015 (ref 1.005–1.030)
pH: 6.5 (ref 5.0–8.0)

## 2019-08-26 LAB — URINALYSIS, MICROSCOPIC (REFLEX)

## 2019-08-26 LAB — LIPASE, BLOOD: Lipase: 25 U/L (ref 11–51)

## 2019-08-26 LAB — CBC
HCT: 47.1 % — ABNORMAL HIGH (ref 36.0–46.0)
Hemoglobin: 15.7 g/dL — ABNORMAL HIGH (ref 12.0–15.0)
MCH: 31.8 pg (ref 26.0–34.0)
MCHC: 33.3 g/dL (ref 30.0–36.0)
MCV: 95.3 fL (ref 80.0–100.0)
Platelets: 232 10*3/uL (ref 150–400)
RBC: 4.94 MIL/uL (ref 3.87–5.11)
RDW: 12.8 % (ref 11.5–15.5)
WBC: 14.2 10*3/uL — ABNORMAL HIGH (ref 4.0–10.5)
nRBC: 0 % (ref 0.0–0.2)

## 2019-08-26 LAB — SARS CORONAVIRUS 2 BY RT PCR (HOSPITAL ORDER, PERFORMED IN ~~LOC~~ HOSPITAL LAB): SARS Coronavirus 2: NEGATIVE

## 2019-08-26 MED ORDER — IOHEXOL 300 MG/ML  SOLN
100.0000 mL | Freq: Once | INTRAMUSCULAR | Status: AC | PRN
  Administered 2019-08-26: 100 mL via INTRAVENOUS

## 2019-08-26 MED ORDER — SODIUM CHLORIDE 0.9 % IV BOLUS
1000.0000 mL | Freq: Once | INTRAVENOUS | Status: AC
  Administered 2019-08-26: 1000 mL via INTRAVENOUS

## 2019-08-26 MED ORDER — KETOROLAC TROMETHAMINE 15 MG/ML IJ SOLN
15.0000 mg | Freq: Once | INTRAMUSCULAR | Status: AC
  Administered 2019-08-26: 16:00:00 15 mg via INTRAVENOUS
  Filled 2019-08-26: qty 1

## 2019-08-26 MED ORDER — VANCOMYCIN HCL 125 MG PO CAPS: 125.0000 mg | ORAL_CAPSULE | Freq: Four times a day (QID) | ORAL | 0 refills | Status: AC

## 2019-08-26 MED ORDER — AZITHROMYCIN 250 MG PO TABS: ORAL_TABLET | ORAL | 0 refills | Status: DC

## 2019-08-26 MED ORDER — ONDANSETRON HCL 4 MG/2ML IJ SOLN
4.0000 mg | Freq: Once | INTRAMUSCULAR | Status: AC
  Administered 2019-08-26: 16:00:00 4 mg via INTRAVENOUS
  Filled 2019-08-26: qty 2

## 2019-08-26 MED ORDER — FENTANYL CITRATE (PF) 100 MCG/2ML IJ SOLN
25.0000 ug | Freq: Once | INTRAMUSCULAR | Status: AC
  Administered 2019-08-26: 25 ug via INTRAVENOUS
  Filled 2019-08-26: qty 2

## 2019-08-26 NOTE — Discharge Instructions (Addendum)
You were evaluated in the Emergency Department and after careful evaluation, we did not find any emergent condition requiring admission or further testing in the hospital.  You have inflammation and diarrhea likely caused by bacterial infection.  Please take both antibiotics as directed.  Please return to the Emergency Department if you experience any worsening of your condition.  We encourage you to follow up with a primary care provider.  Thank you for allowing [place] to be a part of your care.

## 2019-08-26 NOTE — ED Provider Notes (Signed)
[facility] Emergency Department Provider Note MRN:  [id number]  Arrival date & time: 08/26/19     Chief Complaint   Abdominal Pain   History of Present Illness   Lindsey Hall is a 38 y.o. year-old female with a history of GERD presenting to the ED with chief complaint of abdominal pain.  Symptoms began 4 days ago with mild abdominal bloating.  Diarrhea began the next day, becoming more more severe and frequent.  Has noted some small amount of blood today.  Having worsening diffuse abdominal pain, moderate to severe, described as dull and cramping.  Denies fever, no nausea or vomiting, no chest pain or shortness of breath, no vaginal bleeding or discharge.  No exacerbating or alleviating factors.  Review of Systems  A complete 10 system review of systems was obtained and all systems are negative except as noted in the HPI and PMH.   Patient's Health History    Past Medical History:  Diagnosis Date  . GERD (gastroesophageal reflux disease)   . Sinusitis     Past Surgical History:  Procedure Laterality Date  . TYMPANOSTOMY TUBE PLACEMENT      Family History  Problem Relation Age of Onset  . Migraines Mother   . Asthma Brother   . Allergic rhinitis Neg Hx   . Angioedema Neg Hx   . Eczema Neg Hx   . Immunodeficiency Neg Hx   . Urticaria Neg Hx     Social History   Socioeconomic History  . Marital status: Married    Spouse name: Not on file  . Number of children: Not on file  . Years of education: Not on file  . Highest education level: Not on file  Occupational History  . Not on file  Social Needs  . Financial resource strain: Not on file  . Food insecurity    Worry: Not on file    Inability: Not on file  . Transportation needs    Medical: Not on file    Non-medical: Not on file  Tobacco Use  . Smoking status: Never Smoker  . Smokeless tobacco: Never Used  Substance and Sexual Activity  . Alcohol use: Yes  . Drug use: No  .  Sexual activity: Not on file  Lifestyle  . Physical activity    Days per week: Not on file    Minutes per session: Not on file  . Stress: Not on file  Relationships  . Social Herbalist on phone: Not on file    Gets together: Not on file    Attends religious service: Not on file    Active member of club or organization: Not on file    Attends meetings of clubs or organizations: Not on file    Relationship status: Not on file  . Intimate partner violence    Fear of current or ex partner: Not on file    Emotionally abused: Not on file    Physically abused: Not on file    Forced sexual activity: Not on file  Other Topics Concern  . Not on file  Social History Narrative  . Not on file     Physical Exam  Vital Signs and Nursing Notes reviewed Vitals:   08/26/19 1630 08/26/19 1835  BP: 114/74 120/75  Pulse: 60 60  Resp: 13 14  Temp:    SpO2: 100% 100%    CONSTITUTIONAL: Well-appearing, NAD NEURO:  Alert and oriented x 3, no focal deficits EYES:  eyes equal and reactive ENT/NECK:  no LAD, no JVD CARDIO: Regular rate, well-perfused, normal S1 and S2 PULM:  CTAB no wheezing or rhonchi GI/GU:  normal bowel sounds, non-distended, diffuse moderate tenderness to palpation with guarding MSK/SPINE:  No gross deformities, no edema SKIN:  no rash, atraumatic PSYCH:  Appropriate speech and behavior  Diagnostic and Interventional Summary    Labs Reviewed  COMPREHENSIVE METABOLIC PANEL - Abnormal; Notable for the following components:      Result Value   Sodium 131 (*)    Chloride 97 (*)    Glucose, Bld 141 (*)    Calcium 8.5 (*)    Total Protein 6.2 (*)    Albumin 3.4 (*)    All other components within normal limits  CBC - Abnormal; Notable for the following components:   WBC 14.2 (*)    Hemoglobin 15.7 (*)    HCT 47.1 (*)    All other components within normal limits  URINALYSIS, ROUTINE W REFLEX MICROSCOPIC - Abnormal; Notable for the following components:    Hgb urine dipstick TRACE (*)    Ketones, ur 15 (*)    All other components within normal limits  URINALYSIS, MICROSCOPIC (REFLEX) - Abnormal; Notable for the following components:   Bacteria, UA RARE (*)    All other components within normal limits  SARS CORONAVIRUS 2 (HOSPITAL ORDER, PERFORMED IN [facility])  LIPASE, BLOOD  PREGNANCY, URINE    CT ABDOMEN PELVIS W CONTRAST  Final Result      Medications  sodium chloride 0.9 % bolus 1,000 mL (0 mLs Intravenous Stopped 08/26/19 1717)  ondansetron (ZOFRAN) injection 4 mg (4 mg Intravenous Given 08/26/19 1549)  ketorolac (TORADOL) 15 MG/ML injection 15 mg (15 mg Intravenous Given 08/26/19 1549)  fentaNYL (SUBLIMAZE) injection 25 mcg (25 mcg Intravenous Given 08/26/19 1549)  iohexol (OMNIPAQUE) 300 MG/ML solution 100 mL (100 mLs Intravenous Contrast Given 08/26/19 1717)     Procedures Critical Care  ED Course and Medical Decision Making  I have reviewed the triage vital signs and the nursing notes.  Pertinent labs & imaging results that were available during my care of the patient were reviewed by me and considered in my medical decision making (see below for details).  Considering perforated viscus, appendicitis, diverticulitis, viral or bacterial infectious diarrhea.  Labs reveal leukocytosis, there is guarding on exam.  CT abdomen is pending.  CT reveals extensive inflammation of the colon consistent with nonspecific colitis.  Upon further questioning, patient did have a antibiotic course back in June.  This raises concern for possible C. difficile colitis.  Also considering other bacterial diarrheal illnesses.  Patient unfortunately is unable to provide a stool sample at this time, is feeling much better, has kids at home, is requesting discharge.  It is nonideal to empirically treat her current condition with a stool sample to guide us.  However she is not willing to stay for stool sample analysis, she has no PCP to follow-up  with.  Will treat empirically with vancomycin as well as azithromycin.  Strict return precautions for worsening pain or fever.  Elmer SowMichael M. Pilar PlateBero, MD [facility] [email or web address]  Final Clinical Impressions(s) / ED Diagnoses     ICD-10-CM   1. Infectious diarrhea  A09     ED Discharge Orders         Ordered    vancomycin (VANCOCIN HCL) 125 MG capsule  4 times daily     08/26/19 1844  azithromycin (ZITHROMAX) 250 MG tablet     08/26/19 1844          Discharge Instructions Discussed with and Provided to Patient:   Discharge Instructions     You were evaluated in the Emergency Department and after careful evaluation, we did not find any emergent condition requiring admission or further testing in the hospital.  You have inflammation and diarrhea likely caused by bacterial infection.  Please take both antibiotics as directed.  Please return to the Emergency Department if you experience any worsening of your condition.  We encourage you to follow up with a primary care provider.  Thank you for allowing [place] to be a part of your care.       Sabas Sous, MD 08/26/19 [phone]

## 2019-08-26 NOTE — ED Triage Notes (Signed)
Pt c/o abdominal pain with nausea x 5 days, with pt reporting worsening symptoms on Friday. Pt reports loc on Friday r/t "being dehydrated". Pt verbalizes decreased appetite, and diarrhea. Pt denies otc medications today. Reports taking immodium yesterday. Denies fever, also states passing blood in stool. Pt also c/o back pain.

## 2019-08-26 NOTE — ED Notes (Signed)
CT awaiting results from pregnancy test prior to scanning per radiology protocol

## 2019-08-26 NOTE — ED Notes (Signed)
Taken to CT at this time. 

## 2019-08-31 ENCOUNTER — Ambulatory Visit (INDEPENDENT_AMBULATORY_CARE_PROVIDER_SITE_OTHER): Payer: PRIVATE HEALTH INSURANCE

## 2019-08-31 DIAGNOSIS — J309 Allergic rhinitis, unspecified: Secondary | ICD-10-CM

## 2019-09-20 ENCOUNTER — Ambulatory Visit: Payer: PRIVATE HEALTH INSURANCE | Admitting: Family Medicine

## 2019-09-20 ENCOUNTER — Ambulatory Visit: Payer: Self-pay

## 2019-09-20 ENCOUNTER — Encounter: Payer: Self-pay | Admitting: Family Medicine

## 2019-09-20 ENCOUNTER — Other Ambulatory Visit: Payer: Self-pay

## 2019-09-20 VITALS — BP 94/62 | HR 61 | Temp 98.6°F | Resp 12 | Ht 64.8 in | Wt 115.8 lb

## 2019-09-20 DIAGNOSIS — J3089 Other allergic rhinitis: Secondary | ICD-10-CM | POA: Diagnosis not present

## 2019-09-20 DIAGNOSIS — J302 Other seasonal allergic rhinitis: Secondary | ICD-10-CM | POA: Diagnosis not present

## 2019-09-20 DIAGNOSIS — J309 Allergic rhinitis, unspecified: Secondary | ICD-10-CM

## 2019-09-20 DIAGNOSIS — K219 Gastro-esophageal reflux disease without esophagitis: Secondary | ICD-10-CM

## 2019-09-20 MED ORDER — EPINEPHRINE 0.3 MG/0.3ML IJ SOAJ: INTRAMUSCULAR | 3 refills | Status: AC

## 2019-09-20 NOTE — Patient Instructions (Addendum)
Allergic rhinitis Continue allergy immunotherapy and have access epinephrine auto-injector Continue avoidance measures directed toward weed pollen and molds Continue Flonase or Nasacort 2 sprays in each nostril once a day as needed for a stuffy nose. Right nostril- point applicator out toward your right ear. Left nostril- point applicator out toward your left ear Consider saline nasal rinses as needed for nasal symptoms. Use this before any medicated nasal sprays for best result  GERD Continue dietary and lifestyle modifications  Call the clinic if this treatment plan is not working well for you  Follow up in 1 year or sooner if needed.

## 2019-09-20 NOTE — Progress Notes (Addendum)
[address] [place] [address] Dept: [phone]  FOLLOW UP NOTE  Patient ID: Lindsey Hall, female    DOB: 09/03/1981  Age: 38 y.o. MRN: [id number] Date of Office Visit: 09/20/2019  Assessment  Chief Complaint: Allergic Rhinitis   HPI Lindsey Hall is a 38 year old female who presents to the clinic for a follow up visit. She was last seen in this clinic for evaluation of allergic rhinitis, reflux, and acute sinusitis. At today's visit, she reports that her allergic rhinitis has been well controlled with occasional nasal itchiness for which she occasionally uses Sudafed 30 mg with relief of symptoms. She is not currently using nasal saline rinses or Flonase. She reports she has not had a sinus infection over the last summer, "which is huge" for her. She reports allergen immunotherapy is going well with no large local reactions. She reports a significant decrease in her allergic rhinitis symptoms while continuing on allergen immunotherapy. Reflux is reported as well controlled with dietary and lifestyle modifications. Her current medications are listed in the chart.    Drug Allergies:  Allergies  Allergen Reactions  . Other     avocado caused vomiting and diarrhea with stomach discomfort for several days    Physical Exam: BP 94/62   Pulse 61   Temp 98.6 F (37 C) (Oral)   Resp 12   Ht 5' 4.8" (1.646 m)   Wt 115 lb 12.8 oz (52.5 kg)   LMP  (LMP Unknown)   SpO2 100%   BMI 19.39 kg/m    Physical Exam Vitals signs reviewed.  Constitutional:      Appearance: Normal appearance.  HENT:     Head: Normocephalic and atraumatic.     Right Ear: Tympanic membrane normal.     Left Ear: Tympanic membrane normal.     Nose:     Comments: Bilateral nares slightly erythematous with no nasal drainage noted. Pharynx normal. Ears normal. Eyes normal.    Mouth/Throat:     Pharynx: Oropharynx is clear.  Eyes:     Conjunctiva/sclera: Conjunctivae normal.  Neck:   Musculoskeletal: Normal range of motion and neck supple.  Cardiovascular:     Rate and Rhythm: Normal rate and regular rhythm.     Heart sounds: Normal heart sounds. No murmur.  Pulmonary:     Effort: Pulmonary effort is normal.     Breath sounds: Normal breath sounds.     Comments: Lungs clear to auscultation Musculoskeletal: Normal range of motion.  Skin:    General: Skin is warm and dry.  Neurological:     Mental Status: She is alert and oriented to person, place, and time.  Psychiatric:        Mood and Affect: Mood normal.        Behavior: Behavior normal.        Thought Content: Thought content normal.        Judgment: Judgment normal.      Assessment and Plan: 1. Seasonal and perennial allergic rhinitis     Meds ordered this encounter  Medications  . EPINEPHrine (AUVI-Q) 0.3 mg/0.3 mL IJ SOAJ injection    Sig: Use as directed    Dispense:  2 each    Refill:  3    Patient Instructions  Allergic rhinitis Continue allergy immunotherapy and have access epinephrine auto-injector Continue avoidance measures directed toward weed pollen and molds Continue Flonase or Nasacort 2 sprays in each nostril once a day as needed for a stuffy nose.  Right nostril- point applicator out toward your right ear. Left nostril- point applicator out toward your left ear Consider saline nasal rinses as needed for nasal symptoms. Use this before any medicated nasal sprays for best result  Call the clinic if this treatment plan is not working well for you  Follow up in 1 year or sooner if needed.    Return in about 1 year (around 09/19/2020), or if symptoms worsen or fail to improve.    Thank you for the opportunity to care for this patient.  Please do not hesitate to contact me with questions.  Thermon Leyland, FNP [facility]  ________________________________________________  I have provided oversight concerning [facility]'s evaluation and treatment of this  patient's health issues addressed during today's encounter.  I agree with the assessment and therapeutic plan as outlined in the note.   Signed,   R Jorene Guest, MD

## 2019-09-27 ENCOUNTER — Ambulatory Visit (INDEPENDENT_AMBULATORY_CARE_PROVIDER_SITE_OTHER): Payer: PRIVATE HEALTH INSURANCE

## 2019-09-27 DIAGNOSIS — J309 Allergic rhinitis, unspecified: Secondary | ICD-10-CM | POA: Diagnosis not present

## 2019-11-01 ENCOUNTER — Ambulatory Visit (INDEPENDENT_AMBULATORY_CARE_PROVIDER_SITE_OTHER): Payer: PRIVATE HEALTH INSURANCE

## 2019-11-01 DIAGNOSIS — J309 Allergic rhinitis, unspecified: Secondary | ICD-10-CM | POA: Diagnosis not present

## 2019-11-21 ENCOUNTER — Ambulatory Visit (INDEPENDENT_AMBULATORY_CARE_PROVIDER_SITE_OTHER): Payer: PRIVATE HEALTH INSURANCE

## 2019-11-21 DIAGNOSIS — J309 Allergic rhinitis, unspecified: Secondary | ICD-10-CM | POA: Diagnosis not present

## 2019-11-29 ENCOUNTER — Ambulatory Visit (INDEPENDENT_AMBULATORY_CARE_PROVIDER_SITE_OTHER): Payer: PRIVATE HEALTH INSURANCE

## 2019-11-29 DIAGNOSIS — J309 Allergic rhinitis, unspecified: Secondary | ICD-10-CM

## 2021-02-11 IMAGING — CT CT ABD-PELV W/ CM
2 of 7 series · 13 of 46 positions shown, 18 images · IV contrast (APPLIED)
Comparison: None.

CLINICAL DATA: Diffuse abdominal pain and guarding. Diarrhea and
diffuse abdominal pain for 3 days.

EXAM:
CT ABDOMEN AND PELVIS WITH CONTRAST
TECHNIQUE: Multidetector CT imaging of the abdomen and pelvis was performed
using the standard protocol following bolus administration of
intravenous contrast.
CONTRAST:  100mL OMNIPAQUE IOHEXOL 300 MG/ML  SOLN

[Series 2: axial st · axial · 0.85mm/px · z∈[-436,-46]mm · 10 of 92 slices shown, 15 images]
[im 7/92  soft-tissue]
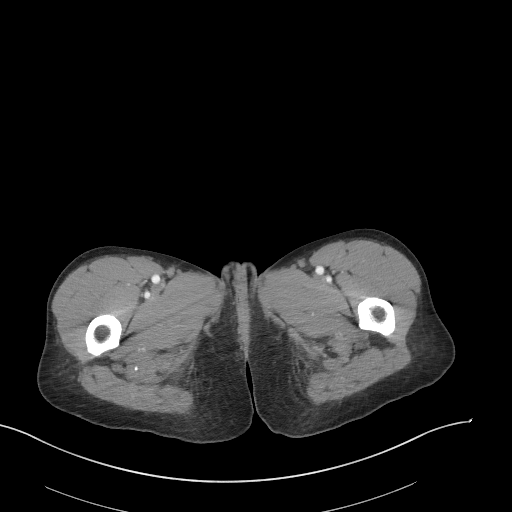
[im 7/92  bone]
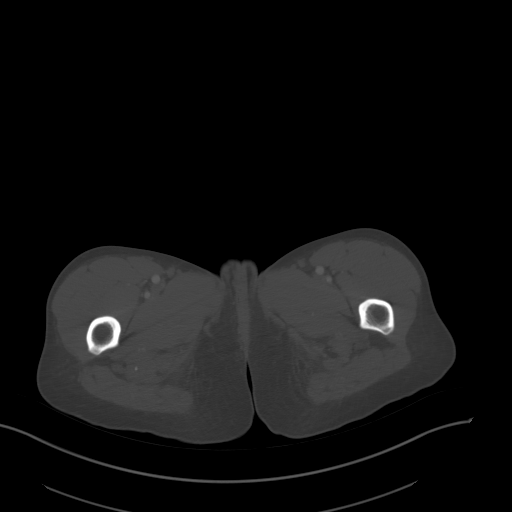
[im 19/92  soft-tissue]
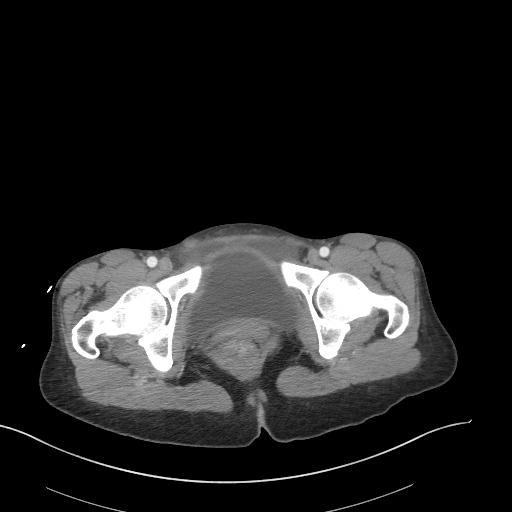
[im 25/92  soft-tissue]
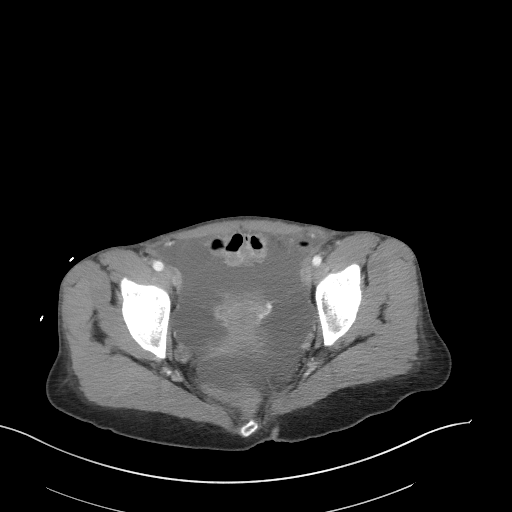
[im 37/92  soft-tissue]
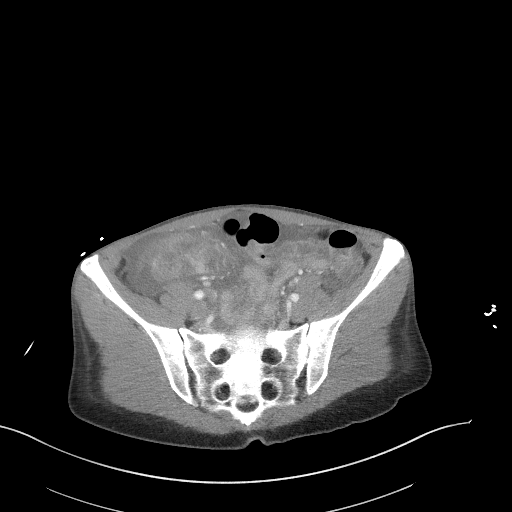
[im 49/92  soft-tissue]
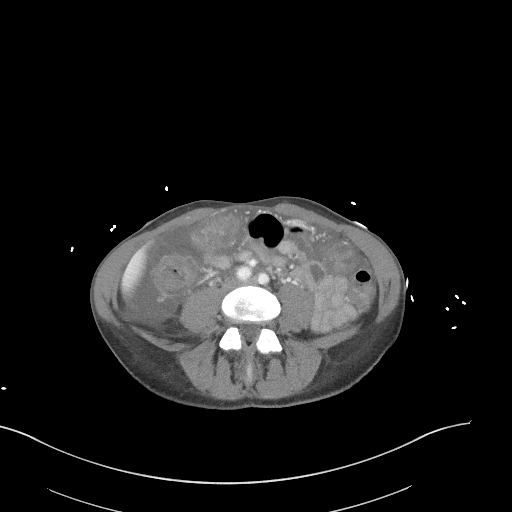
[im 55/92  soft-tissue]
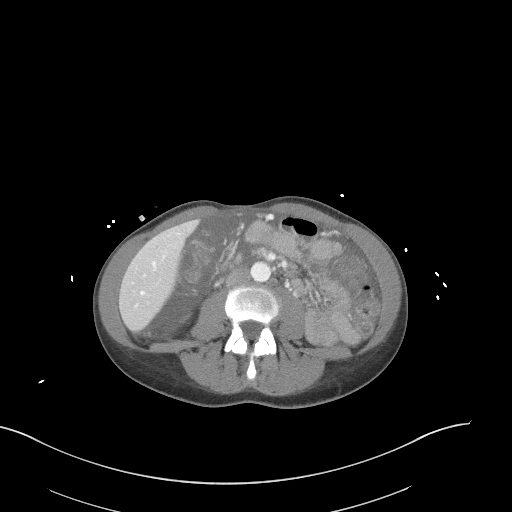
[im 67/92  soft-tissue]
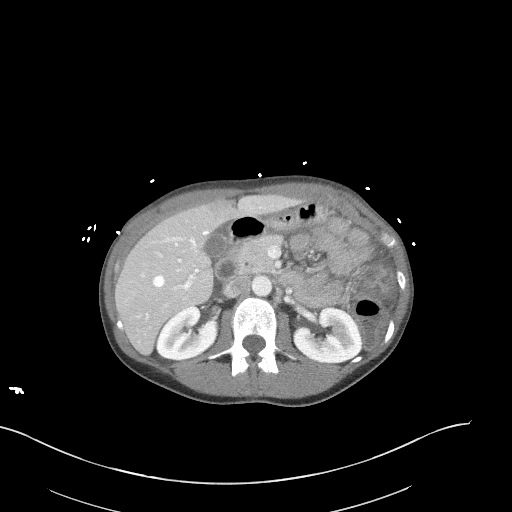
[im 67/92  lung]
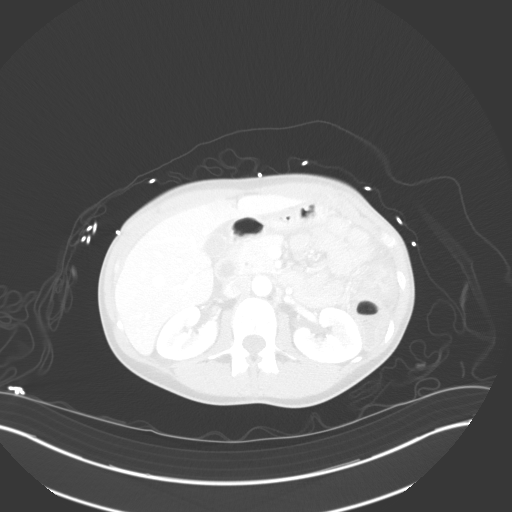
[im 73/92  soft-tissue]
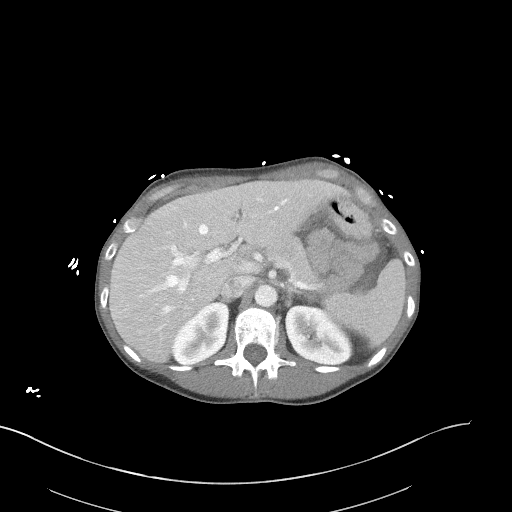
[im 73/92  lung]
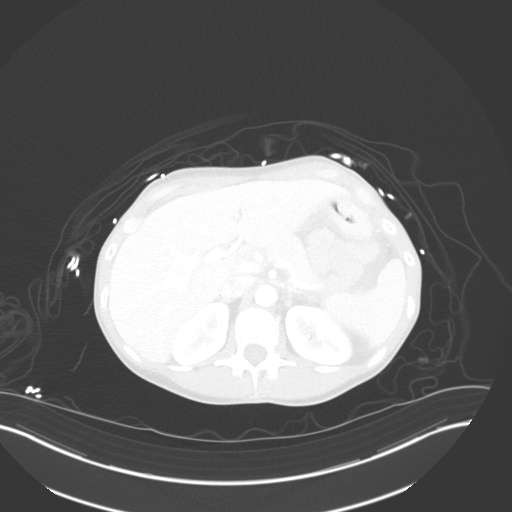
[im 79/92  lung]
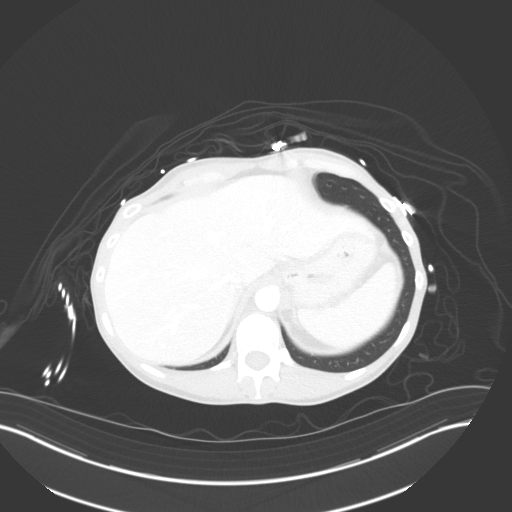
[im 85/92  soft-tissue]
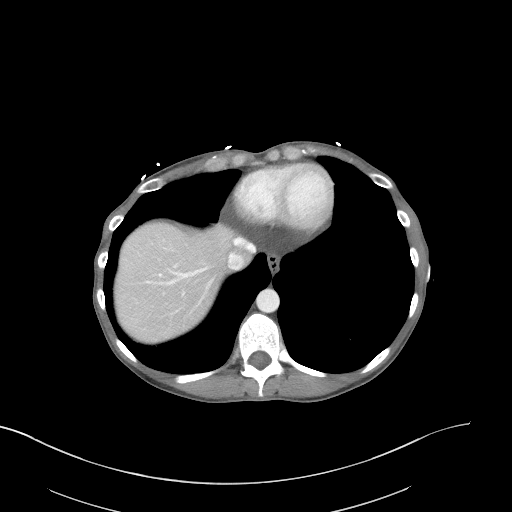
[im 85/92  lung]
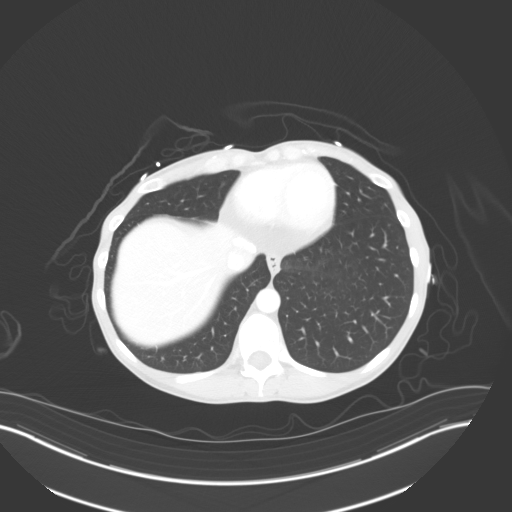
[im 85/92  bone]
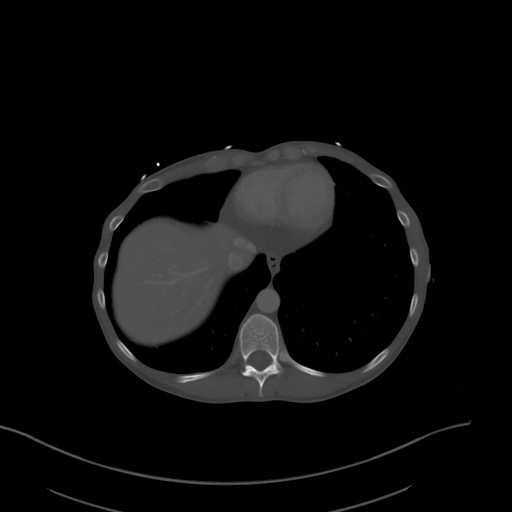

[Series 8: coronal st · coronal · 0.68mm/px · 3 of 101 slices shown]
[im 21/101  soft-tissue]
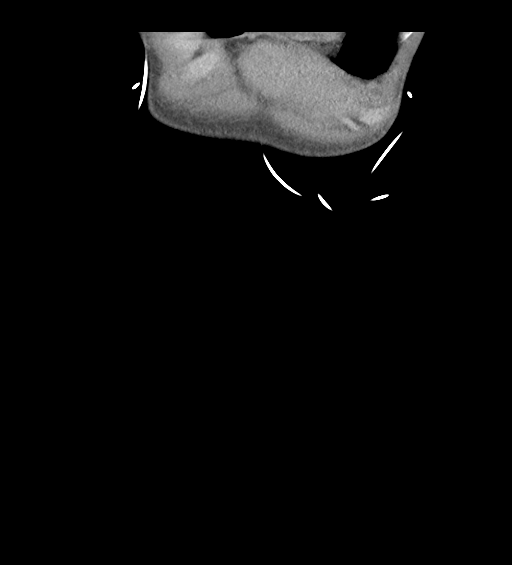
[im 41/101  soft-tissue]
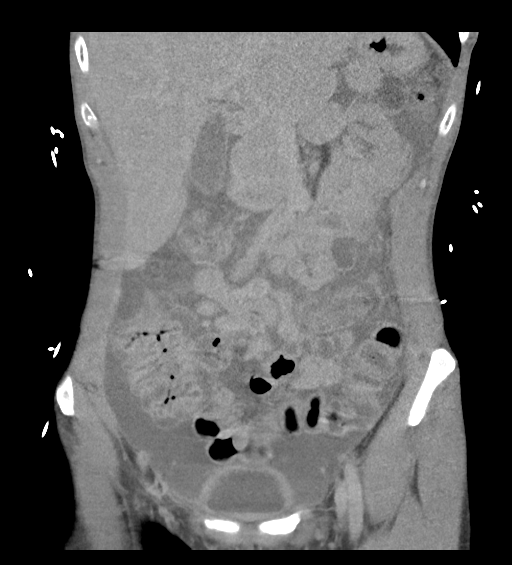
[im 61/101  soft-tissue]
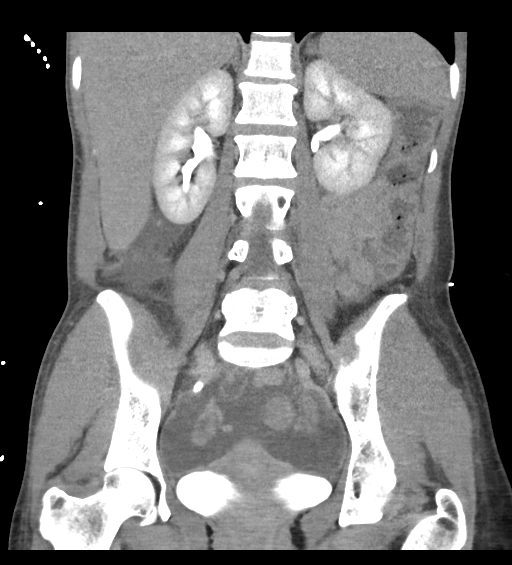

[13 of 46 positions shown; findings below may reference images not displayed]

FINDINGS: Lower chest: The lung bases are clear without focal nodule, mass, or
airspace disease. The heart size is normal. No significant pleural
or pericardial effusion is present.

Hepatobiliary: No focal liver abnormality is seen. No gallstones,
gallbladder wall thickening, or biliary dilatation.

Pancreas: Unremarkable. No pancreatic ductal dilatation or
surrounding inflammatory changes.

Spleen: Normal in size without focal abnormality.

Adrenals/Urinary Tract: Adrenal glands are unremarkable. Kidneys are
normal, without renal calculi, focal lesion, or hydronephrosis.
Bladder is unremarkable.

Stomach/Bowel: Stomach and duodenum are within normal limits. The
small bowel is unremarkable. Terminal ileum is within normal limits.
The appendix is not discretely seen. Extensive inflammatory changes
are present about the ascending and transverse colon. Wall
thickening extends to the splenic flexure. A more normal appearance
of the colon wall is present in the descending and sigmoid colon. No
focal mass lesion is present.

Vascular/Lymphatic: No significant vascular findings are present. No
enlarged abdominal or pelvic lymph nodes.

Reproductive: Uterus and bilateral adnexa are unremarkable.

Other: Extensive abdominal ascites are present. Fluid is more
prominent right than left, corresponding to the ascending and
transverse colitis. Extensive fluid extends into the anatomic
pelvis. A ventral hernia containing fat but no bowel measures
approximately 1.5 cm.

Musculoskeletal: Normal lumbar lordosis is present. Bony pelvis is
within normal limits. The hips are located and normal
IMPRESSION: 1. Extensive inflammatory changes within the ascending and
transverse colon consistent with a nonspecific colitis. Question
recent antibiotic use. Infectious colitis is not excluded.
2. Extensive abdominal ascites is likely reactive.
